# Patient Record
Sex: Female | Born: 1995 | Race: Black or African American | Hispanic: No | Marital: Single | Smoking: Never smoker
Health system: Southern US, Community
[De-identification: ages and names within clinical notes are randomized; demographics above are authoritative.]

## PROBLEM LIST (undated history)

## (undated) DIAGNOSIS — F419 Anxiety disorder, unspecified: Secondary | ICD-10-CM

---

## 2018-12-17 ENCOUNTER — Emergency Department
Admission: EM | Admit: 2018-12-17 | Discharge: 2018-12-17 | Disposition: A | Discharge status: Home / Self Care | Payer: BLUE CROSS/BLUE SHIELD | Attending: Emergency Medicine | Admitting: Emergency Medicine

## 2018-12-17 ENCOUNTER — Encounter: Payer: Self-pay | Admitting: Emergency Medicine

## 2018-12-17 ENCOUNTER — Emergency Department: Payer: BLUE CROSS/BLUE SHIELD

## 2018-12-17 ENCOUNTER — Other Ambulatory Visit: Payer: Self-pay

## 2018-12-17 DIAGNOSIS — R1084 Generalized abdominal pain: Secondary | ICD-10-CM | POA: Diagnosis present

## 2018-12-17 DIAGNOSIS — R7401 Elevation of levels of liver transaminase levels: Secondary | ICD-10-CM

## 2018-12-17 DIAGNOSIS — R740 Nonspecific elevation of levels of transaminase and lactic acid dehydrogenase [LDH]: Secondary | ICD-10-CM | POA: Diagnosis not present

## 2018-12-17 LAB — URINALYSIS, COMPLETE (UACMP) WITH MICROSCOPIC
Bilirubin Urine: NEGATIVE
GLUCOSE, UA: NEGATIVE mg/dL
Hgb urine dipstick: NEGATIVE
Ketones, ur: NEGATIVE mg/dL
Leukocytes,Ua: NEGATIVE
NITRITE: NEGATIVE
Protein, ur: NEGATIVE mg/dL
Specific Gravity, Urine: 1.027 (ref 1.005–1.030)
pH: 5 (ref 5.0–8.0)

## 2018-12-17 LAB — COMPREHENSIVE METABOLIC PANEL
ALT: 84 U/L — ABNORMAL HIGH (ref 0–44)
ANION GAP: 10 (ref 5–15)
AST: 147 U/L — ABNORMAL HIGH (ref 15–41)
Albumin: 4.7 g/dL (ref 3.5–5.0)
Alkaline Phosphatase: 62 U/L (ref 38–126)
BILIRUBIN TOTAL: 1.4 mg/dL — AB (ref 0.3–1.2)
BUN: 14 mg/dL (ref 6–20)
CO2: 23 mmol/L (ref 22–32)
Calcium: 8.7 mg/dL — ABNORMAL LOW (ref 8.9–10.3)
Chloride: 103 mmol/L (ref 98–111)
Creatinine, Ser: 0.69 mg/dL (ref 0.44–1.00)
GFR calc Af Amer: >60 mL/min (ref >60)
GFR calc non Af Amer: >60 mL/min (ref >60)
Glucose, Bld: 111 mg/dL — ABNORMAL HIGH (ref 70–99)
Potassium: 3.3 mmol/L — ABNORMAL LOW (ref 3.5–5.1)
Sodium: 136 mmol/L (ref 135–145)
TOTAL PROTEIN: 8.3 g/dL — AB (ref 6.5–8.1)

## 2018-12-17 LAB — CBC
HCT: 40.6 % (ref 36.0–46.0)
Hemoglobin: 13.1 g/dL (ref 12.0–15.0)
MCH: 31.3 pg (ref 26.0–34.0)
MCHC: 32.3 g/dL (ref 30.0–36.0)
MCV: 97.1 fL (ref 80.0–100.0)
Platelets: 265 10*3/uL (ref 150–400)
RBC: 4.18 MIL/uL (ref 3.87–5.11)
RDW: 12 % (ref 11.5–15.5)
WBC: 14.9 10*3/uL — AB (ref 4.0–10.5)
nRBC: 0 % (ref 0.0–0.2)

## 2018-12-17 LAB — POCT PREGNANCY, URINE: Preg Test, Ur: NEGATIVE

## 2018-12-17 LAB — LIPASE, BLOOD: Lipase: 32 U/L (ref 11–51)

## 2018-12-17 MED ORDER — IBUPROFEN 100 MG/5ML PO SUSP
400.0000 mg | Freq: Once | ORAL | Status: AC
  Administered 2018-12-17: 400 mg via ORAL

## 2018-12-17 MED ORDER — IBUPROFEN 100 MG/5ML PO SUSP
ORAL | Status: AC
  Filled 2018-12-17: qty 20

## 2018-12-17 MED ORDER — SODIUM CHLORIDE 0.9% FLUSH: 3.0000 mL | Freq: Once | INTRAVENOUS | Status: DC

## 2018-12-17 MED ORDER — IBUPROFEN 400 MG PO TABS
400.0000 mg | ORAL_TABLET | Freq: Once | ORAL | Status: DC
  Filled 2018-12-17: qty 1

## 2018-12-17 NOTE — Discharge Instructions (Signed)
As explained to you your labs showed abnormal liver enzymes.  Ultrasound of your liver was normal.  It is very important that you follow-up with your doctor within the next 3 to 4 days to repeat the lab work to make sure this is not getting worse.  It is also important to you return to the emergency room if you have abdominal pain, fever, vomiting.

## 2018-12-17 NOTE — ED Provider Notes (Addendum)
[facility] Emergency Department Provider Note  ____________________________________________  Time seen: Approximately 9:17 PM  I have reviewed the triage vital signs and the nursing notes.   HISTORY  Chief Complaint Abdominal Pain   HPI Deborah Floyd is a 23 y.o. female no significant past medical history who presents for evaluation of abdominal pain.  Patient reports mild diffuse pressure-like abdominal pain worse in the center of her abdomen which has been present most of the day today.  She reports that after getting to a room in the ER she urinated and the pain went away.  She denies nausea, vomiting, diarrhea, fever, chills, dysuria, hematuria, vaginal bleeding, vaginal discharge.  She denies any prior abdominal surgeries.  She reports that she has been constipated with the last bowel movement being 2 days ago.  PMH None - reviewed  Allergies Patient has no known allergies.  History reviewed. No pertinent family history.  Social History Smoking - no Alcohol - no Drugs - no  Review of Systems  Constitutional: Negative for fever. Eyes: Negative for visual changes. ENT: Negative for sore throat. Neck: No neck pain  Cardiovascular: Negative for chest pain. Respiratory: Negative for shortness of breath. Gastrointestinal: + abdominal pain and constipation. No vomiting or diarrhea. Genitourinary: Negative for dysuria. Musculoskeletal: Negative for back pain. Skin: Negative for rash. Neurological: Negative for headaches, weakness or numbness. Psych: No SI or HI  ____________________________________________   PHYSICAL EXAM:  VITAL SIGNS: ED Triage Vitals [12/17/18 2043]  Enc Vitals Group     BP 117/69     Pulse Rate 98     Resp 17     Temp 98.6 F (37 C)     Temp src      SpO2 99 %     Weight 93 lb (42.2 kg)     Height 5' (1.524 m)     Head Circumference      Peak Flow      Pain Score      Pain Loc      Pain Edu?    Excl. in [place]?     Constitutional: Alert and oriented. Well appearing and in no apparent distress. HEENT:      Head: Normocephalic and atraumatic.         Eyes: Conjunctivae are normal. Sclera is non-icteric.       Mouth/Throat: Mucous membranes are moist.       Neck: Supple with no signs of meningismus. Cardiovascular: Regular rate and rhythm. No murmurs, gallops, or rubs. 2+ symmetrical distal pulses are present in all extremities. No JVD. Respiratory: Normal respiratory effort. Lungs are clear to auscultation bilaterally. No wheezes, crackles, or rhonchi.  Gastrointestinal: Soft, non tender, and non distended with positive bowel sounds. No rebound or guarding. Genitourinary: No CVA tenderness. Musculoskeletal: Nontender with normal range of motion in all extremities. No edema, cyanosis, or erythema of extremities. Neurologic: Normal speech and language. Face is symmetric. Moving all extremities. No gross focal neurologic deficits are appreciated. Skin: Skin is warm, dry and intact. No rash noted. Psychiatric: Mood and affect are normal. Speech and behavior are normal.  ____________________________________________   LABS (all labs ordered are listed, but only abnormal results are displayed)  Labs Reviewed  COMPREHENSIVE METABOLIC PANEL - Abnormal; Notable for the following components:      Result Value   Potassium 3.3 (*)    Glucose, Bld 111 (*)    Calcium 8.7 (*)    Total Protein 8.3 (*)  AST 147 (*)    ALT 84 (*)    Total Bilirubin 1.4 (*)    All other components within normal limits  CBC - Abnormal; Notable for the following components:   WBC 14.9 (*)    All other components within normal limits  URINALYSIS, COMPLETE (UACMP) WITH MICROSCOPIC - Abnormal; Notable for the following components:   Color, Urine AMBER (*)    APPearance CLEAR (*)    Bacteria, UA RARE (*)    All other components within normal limits  LIPASE, BLOOD  HEPATITIS PANEL, ACUTE  POC URINE PREG, ED    POCT PREGNANCY, URINE   ____________________________________________  EKG  none  ____________________________________________  RADIOLOGY  I have personally reviewed the images performed during this visit and I agree with the Radiologist's read.   Interpretation by Radiologist:  Dg Abdomen 1 View  Result Date: 12/17/2018 CLINICAL DATA:  Abdominal pain EXAM: ABDOMEN - 1 VIEW COMPARISON:  None. FINDINGS: There is stool throughout much of the colon. There is no bowel dilatation or air-fluid level to suggest bowel obstruction. No free air. Lung bases are clear. No abnormal calcifications. IMPRESSION: Diffuse stool throughout much of colon. Question a degree of constipation. No bowel obstruction or free air evident. Lung bases clear. Electronically Signed   By: Bretta Bang III M.D.   On: 12/17/2018 21:30   US Abdomen Limited Ruq  Result Date: 12/17/2018 CLINICAL DATA:  Abnormal liver function tests EXAM: ULTRASOUND ABDOMEN LIMITED RIGHT UPPER QUADRANT COMPARISON:  None. FINDINGS: Gallbladder: No gallstones or wall thickening visualized. No sonographic Murphy sign noted by sonographer. Common bile duct: Diameter: 1 mm Liver: No focal lesion identified. Within normal limits in parenchymal echogenicity. Portal vein is patent on color Doppler imaging with normal direction of blood flow towards the liver. IMPRESSION: Normal right upper quadrant ultrasound. Electronically Signed   By: Deatra Robinson M.D.   On: 12/17/2018 23:11      ____________________________________________   PROCEDURES  Procedure(s) performed: None Procedures Critical Care performed:  None ____________________________________________   INITIAL IMPRESSION / ASSESSMENT AND PLAN / ED COURSE  23 y.o. female no significant past medical history who presents for evaluation of abdominal pressure which has now resolved, no associated symptoms.  Patient is extremely well-appearing, no distress, normal vital signs, abdomen  is soft with no tenderness throughout.  Differential diagnosis including constipation versus indigestion versus UTI versus renal colic vs GB colic.  With no tenderness and completely resolved pain low suspicion for ovarian pathology, appendicitis, diverticulitis, SBO.  Will get a KUB, pregnancy test, basic labs, and urinalysis.  Will give Motrin.  _________________________ 9:44 PM on 12/17/2018 ----------------------------------------- LFTs elevated, mildly elevated Tbili, will get [place] to rule out cholecystitis. Patient remains pain free with no tenderness.   Clinical Course as of Dec 16 2320  Mon Dec 17, 2018  2319 [place] with no acute findings. Patient remains with no abdominal pain or tenderness. Has taken one tylenol recently for menstrual period. No alcohol use. Acute hepatitis panel pending. Will dc with f/u with PCP in 2-3 days for re-check of LFTs, discussed return precautions for recurrence of pain or fever.    [CV]    Clinical Course User Index [CV] Don Perking [place], MD     As part of my medical decision making, I reviewed the following data within the electronic MEDICAL RECORD NUMBER Nursing notes reviewed and incorporated, Labs reviewed , Old chart reviewed, Radiograph reviewed , Notes from prior ED visits and [place] Controlled Substance Database  Pertinent labs & imaging results that were available during my care of the patient were reviewed by me and considered in my medical decision making (see chart for details).    ____________________________________________   FINAL CLINICAL IMPRESSION(S) / ED DIAGNOSES  Final diagnoses:  Transaminitis  Generalized abdominal pain      NEW MEDICATIONS STARTED DURING THIS VISIT:  ED Discharge Orders    None       Note:  This document was prepared using Dragon voice recognition software and may include unintentional dictation errors.    Don Perking, [place], MD 12/17/18 2245    Don Perking [place], MD 12/17/18 2322

## 2018-12-17 NOTE — ED Triage Notes (Signed)
Pt c/o generalized intermittent abdominal pain since 1800 today. Pt denies N/V/D. Pt sts pain is worse when standing or moving.

## 2018-12-19 LAB — HEPATITIS PANEL, ACUTE
HCV Ab: <0.1 s/co ratio (ref 0.0–0.9)
HEP A IGM: NEGATIVE
HEP B C IGM: NEGATIVE
Hepatitis B Surface Ag: NEGATIVE

## 2019-12-22 ENCOUNTER — Ambulatory Visit: Payer: Self-pay | Attending: Internal Medicine

## 2019-12-22 DIAGNOSIS — Z23 Encounter for immunization: Secondary | ICD-10-CM

## 2019-12-22 NOTE — Progress Notes (Signed)
   Covid-19 Vaccination Clinic  Name:  Deborah Floyd    MRN: [id number] DOB: 1995/10/21  12/22/2019  [name] was observed post Covid-19 immunization for 15 minutes without incident. She was provided with Vaccine Information Sheet and instruction to access the V-Safe system.   [name] was instructed to call 911 with any severe reactions post vaccine: Marland Kitchen Difficulty breathing  . Swelling of face and throat  . A fast heartbeat  . A bad rash all over body  . Dizziness and weakness   Immunizations Administered    Name Date Dose VIS Date Route   Pfizer COVID-19 Vaccine 12/22/2019  2:12 PM 0.3 mL 09/06/2019 Intramuscular   Manufacturer: [employer], [employer]   Lot: [id number]   NDC: [address]-0

## 2020-01-14 ENCOUNTER — Ambulatory Visit: Payer: Self-pay | Attending: Internal Medicine

## 2020-01-14 DIAGNOSIS — Z23 Encounter for immunization: Secondary | ICD-10-CM

## 2020-01-14 NOTE — Progress Notes (Signed)
   Covid-19 Vaccination Clinic  Name:  Ayven Glasco    MRN: [id number] DOB: [birth year]  01/14/2020  Ms. Velador was observed post Covid-19 immunization for 15 minutes without incident. She was provided with Vaccine Information Sheet and instruction to access the V-Safe system.   Ms. Wellen was instructed to call 911 with any severe reactions post vaccine: Marland Kitchen Difficulty breathing  . Swelling of face and throat  . A fast heartbeat  . A bad rash all over body  . Dizziness and weakness   Immunizations Administered    Name Date Dose VIS Date Route   Pfizer COVID-19 Vaccine 01/14/2020  2:39 PM 0.3 mL 11/20/2018 Intramuscular   Manufacturer: [employer], [employer]   Lot: [id number]   NDC: [address]-8

## 2021-01-02 IMAGING — US ULTRASOUND ABDOMEN LIMITED
1 series · 14 of 25 positions shown · non-contrast
Comparison: None.

CLINICAL DATA: Abnormal liver function tests

EXAM:
ULTRASOUND ABDOMEN LIMITED RIGHT UPPER QUADRANT

[Series 1: ultrasound abdomen limited · 0.14mm/px · 14 of 47 slices shown]
[im 1/47]
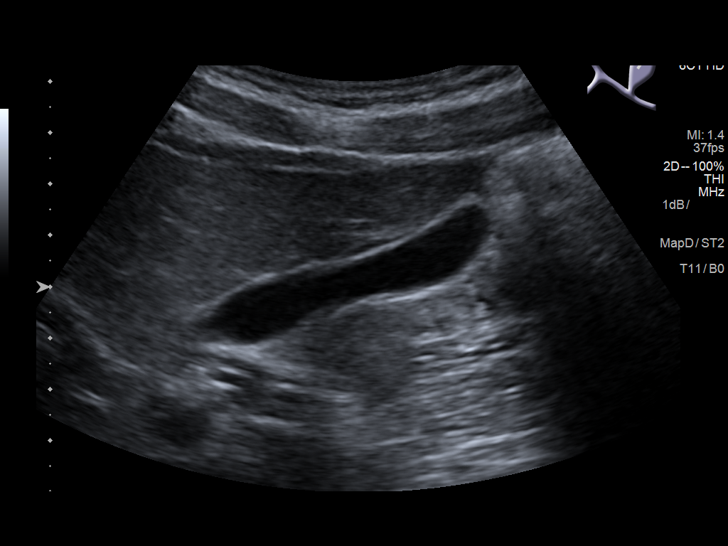
[im 4/47]
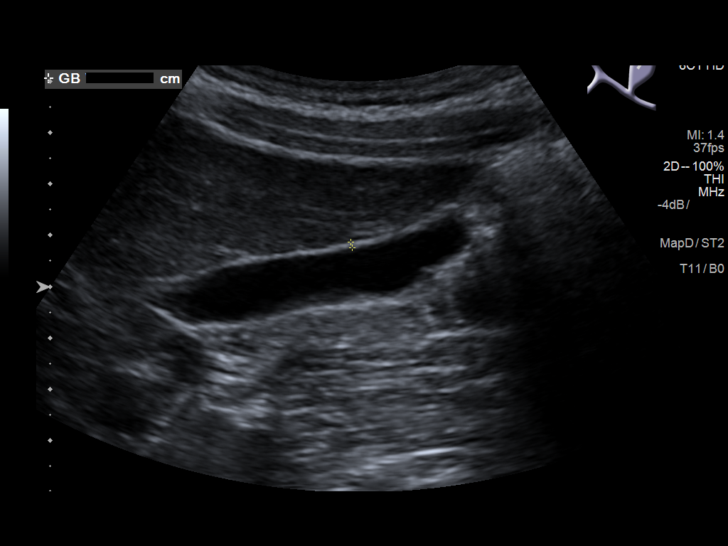
[im 8/47]
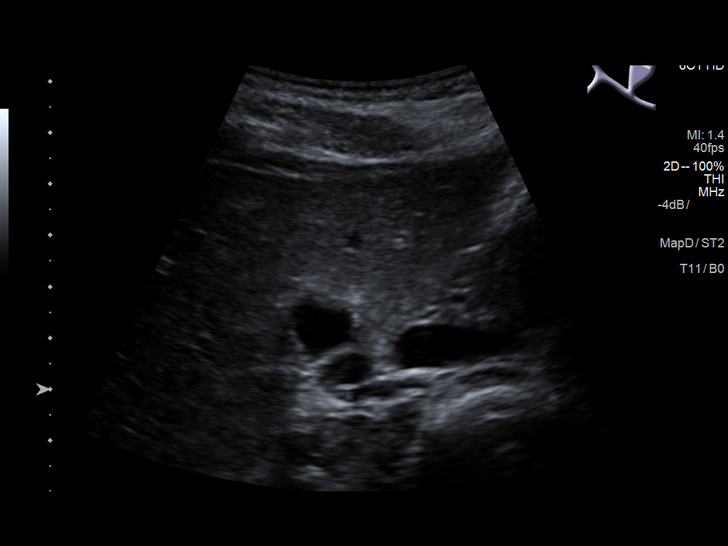
[im 12/47]
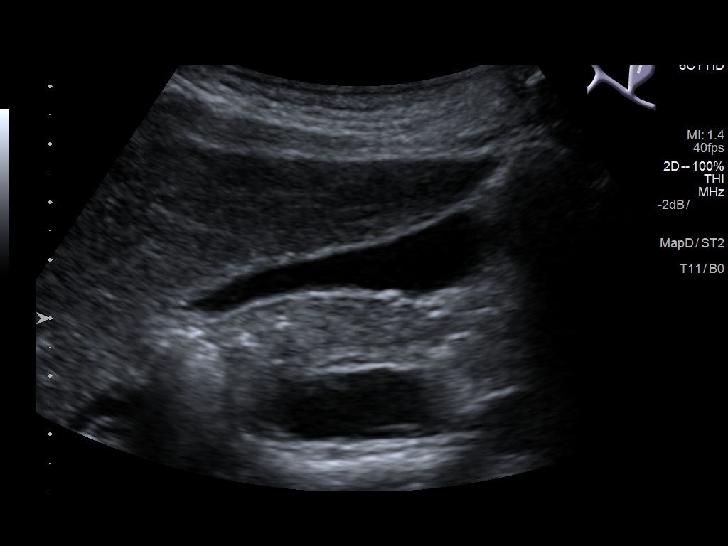
[im 16/47]
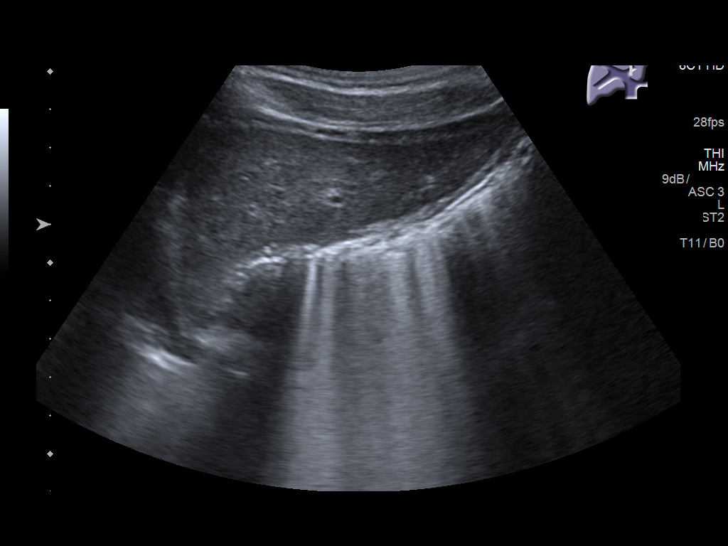
[im 18/47]
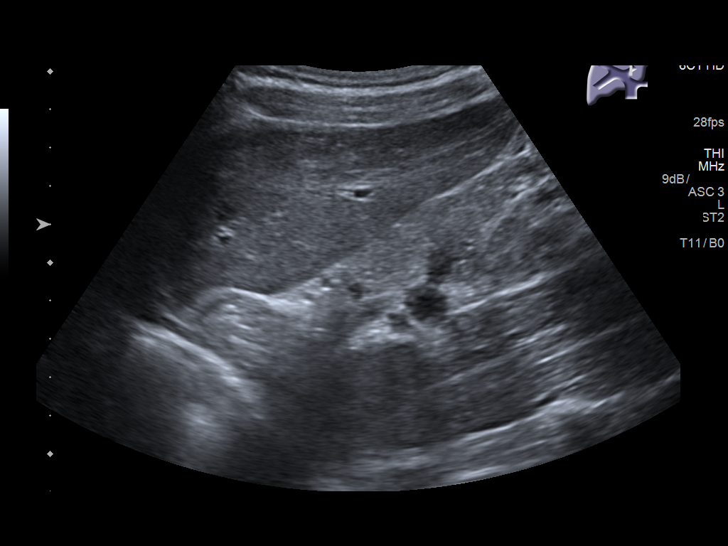
[im 22/47]
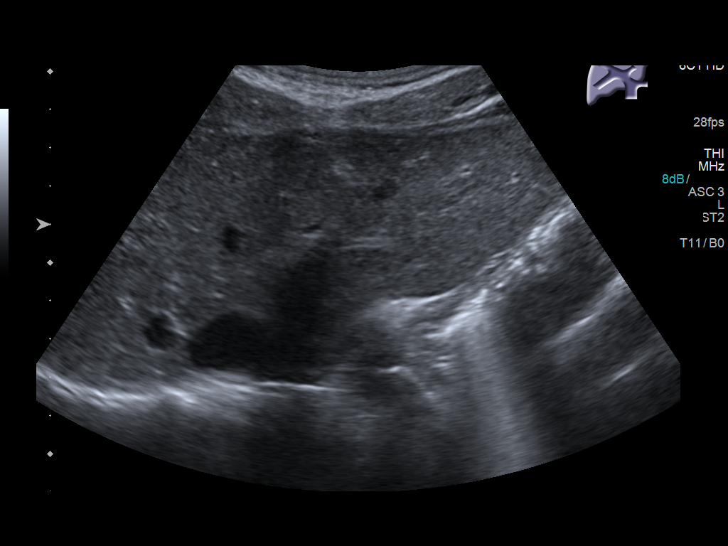
[im 25/47]
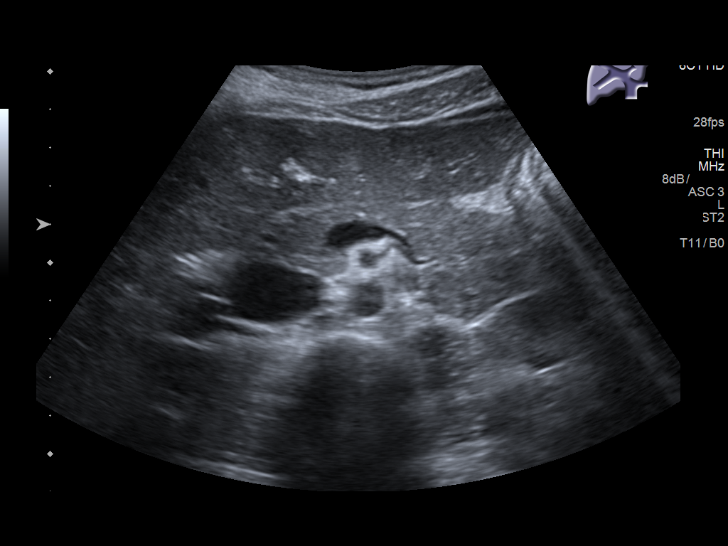
[im 29/47]
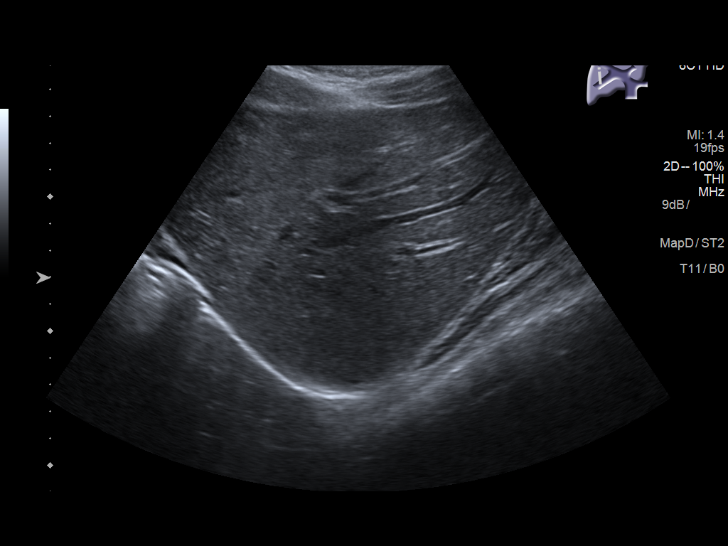
[im 31/47]
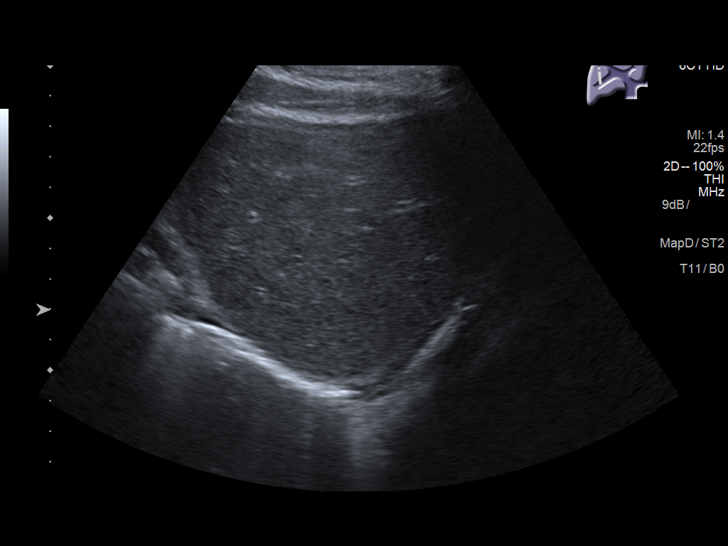
[im 35/47]
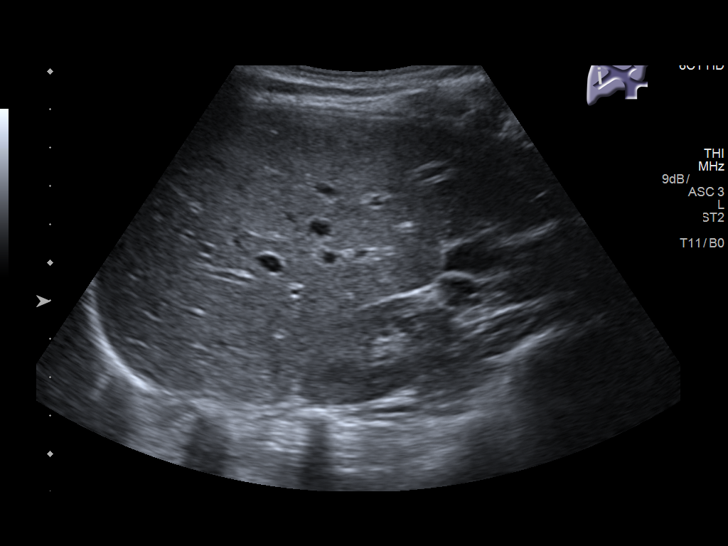
[im 39/47]
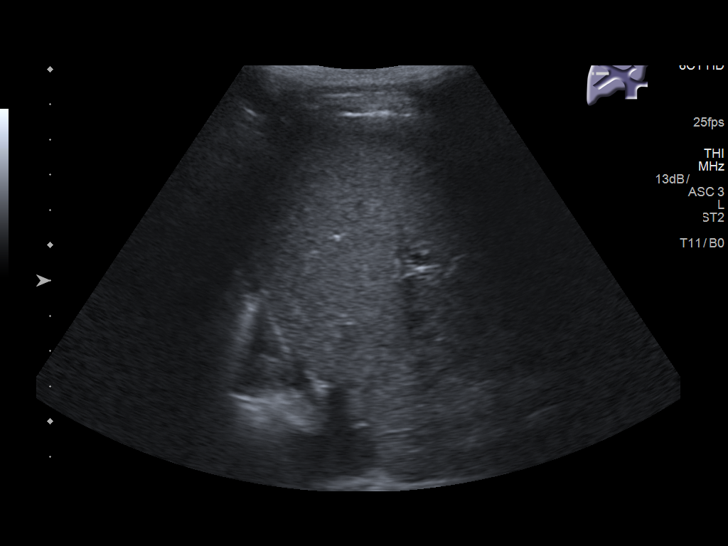
[im 43/47]
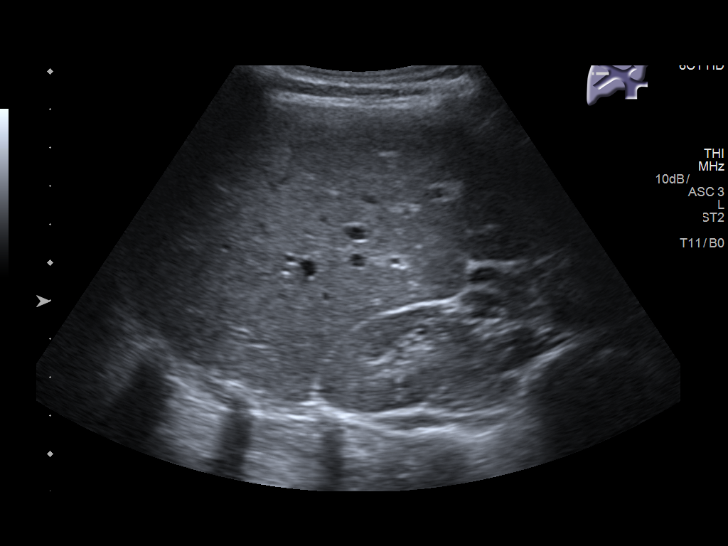
[im 47/47]
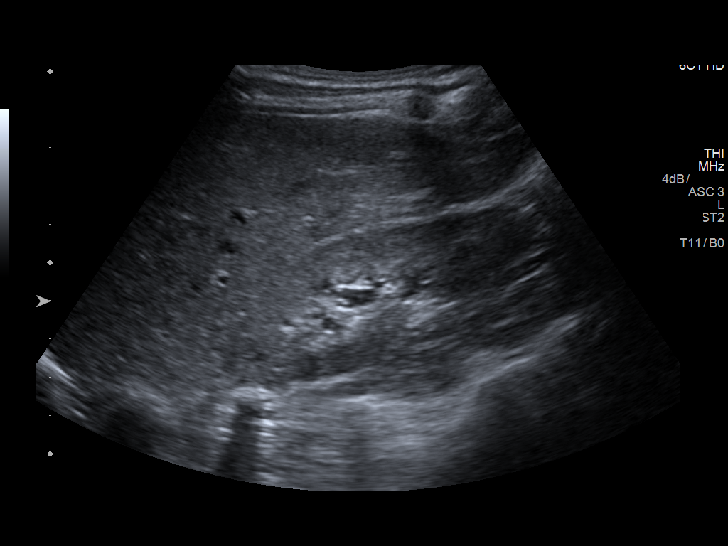

[14 of 25 positions shown; findings below may reference images not displayed]

FINDINGS: Gallbladder:

No gallstones or wall thickening visualized. No sonographic Murphy
sign noted by sonographer.

Common bile duct:

Diameter: 1 mm

Liver:

No focal lesion identified. Within normal limits in parenchymal
echogenicity. Portal vein is patent on color Doppler imaging with
normal direction of blood flow towards the liver.
IMPRESSION: Normal right upper quadrant ultrasound.

## 2021-10-16 ENCOUNTER — Other Ambulatory Visit: Payer: Self-pay

## 2021-10-16 ENCOUNTER — Encounter: Payer: Self-pay | Admitting: Emergency Medicine

## 2021-10-16 ENCOUNTER — Ambulatory Visit
Admission: EM | Admit: 2021-10-16 | Discharge: 2021-10-16 | Disposition: A | Discharge status: Home / Self Care | Payer: BC Managed Care – PPO | Attending: Family Medicine | Admitting: Family Medicine

## 2021-10-16 DIAGNOSIS — J0100 Acute maxillary sinusitis, unspecified: Secondary | ICD-10-CM

## 2021-10-16 MED ORDER — CEFDINIR 250 MG/5ML PO SUSR: 300.0000 mg | Freq: Two times a day (BID) | ORAL | 0 refills | Status: AC

## 2021-10-16 NOTE — ED Provider Notes (Signed)
Deborah Floyd    CSN: [id number] Arrival date & time: 10/16/21  1330      History   Chief Complaint Chief Complaint  Patient presents with   Nasal Congestion   Sinus Pressure   Sore Throat   Cough    HPI Deborah Floyd is a 26 y.o. female.   HPI Patient presents today nasal congestion, scratchy throat, sinus pressure, cough and nasal congestion x 1 week. No measurable fever. No known sick contacts. She has attempted  relief with over the counter medications with minimal relief.Denies chest pain, shortness of breath, or wheezing.   History reviewed. No pertinent past medical history.  There are no problems to display for this patient.   History reviewed. No pertinent surgical history.  OB History   No obstetric history on file.      Home Medications    Prior to Admission medications   Not on File    Family History History reviewed. No pertinent family history.  Social History Social History   Tobacco Use   Smoking status: Never   Smokeless tobacco: Never  Vaping Use   Vaping Use: Never used  Substance Use Topics   Alcohol use: Not Currently   Drug use: Never     Allergies   Patient has no known allergies.   Review of Systems Review of Systems Pertinent negatives listed in HPI  Physical Exam Triage Vital Signs ED Triage Vitals  Enc Vitals Group     BP 10/16/21 1435 129/84     Pulse Rate 10/16/21 1435 (!) 105     Resp 10/16/21 1435 18     Temp 10/16/21 1435 98.5 F (36.9 C)     Temp Source 10/16/21 1435 Oral     SpO2 10/16/21 1435 99 %     Weight --      Height --      Head Circumference --      Peak Flow --      Pain Score 10/16/21 1433 0     Pain Loc --      Pain Edu? --      Excl. in [place]? --    No data found.  Updated Vital Signs BP 129/84 (BP Location: Left Arm)    Pulse (!) 105    Temp 98.5 F (36.9 C) (Oral)    Resp 18    LMP 10/04/2021    SpO2 99%   Visual Acuity Right Eye Distance:   Left Eye  Distance:   Bilateral Distance:    Right Eye Near:   Left Eye Near:    Bilateral Near:     Physical Exam  General Appearance:    Alert, cooperative, no distress  HENT:   Normocephalic, ears normal, nares mucosal edema with congestion, rhinorrhea, oropharynx mild erythema   Eyes:    PERRL, conjunctiva/corneas clear, EOM's intact       Lungs:     Clear to auscultation bilaterally, respirations unlabored  Heart:    Regular rate and rhythm  Neurologic:   Awake, alert, oriented x 3. No apparent focal neurological           defect.      UC Treatments / Results  Labs (all labs ordered are listed, but only abnormal results are displayed) Labs Reviewed - No data to display  EKG   Radiology No results found.  Procedures Procedures (including critical care time)  Medications Ordered in UC Medications - No data to display  Initial Impression /  Assessment and Plan / UC Course  I have reviewed the triage vital signs and the nursing notes.  Pertinent labs & imaging results that were available during my care of the patient were reviewed by me and considered in my medical decision making (see chart for details).    Acute Sinusitis,  Treatment per discharge medication orders. Force fluids RTC PRN if symptoms worsen or do not improve. Final Clinical Impressions(s) / UC Diagnoses   Final diagnoses:  Acute non-recurrent maxillary sinusitis   Discharge Instructions   None    ED Prescriptions     Medication Sig Dispense Auth. Provider   cefdinir (OMNICEF) 250 MG/5ML suspension Take 6 mLs (300 mg total) by mouth 2 (two) times daily for 10 days. 120 mL Scot Jun, FNP      PDMP not reviewed this encounter.   Scot Jun, FNP 10/18/21 [phone]

## 2021-10-16 NOTE — ED Triage Notes (Signed)
Pt presents with scratchy throat, sinus pressure, cough and nasal congestion x 1 week

## 2022-12-23 ENCOUNTER — Ambulatory Visit
Admission: EM | Admit: 2022-12-23 | Discharge: 2022-12-23 | Disposition: A | Discharge status: Home / Self Care | Payer: BC Managed Care – PPO | Attending: Urgent Care | Admitting: Urgent Care

## 2022-12-23 DIAGNOSIS — B9689 Other specified bacterial agents as the cause of diseases classified elsewhere: Secondary | ICD-10-CM

## 2022-12-23 DIAGNOSIS — J019 Acute sinusitis, unspecified: Secondary | ICD-10-CM

## 2022-12-23 MED ORDER — AMOXICILLIN-POT CLAVULANATE 600-42.9 MG/5ML PO SUSR: 875.0000 mg | Freq: Two times a day (BID) | ORAL | 0 refills | Status: AC

## 2022-12-23 MED ORDER — AMOXICILLIN-POT CLAVULANATE 875-125 MG PO TABS: 1.0000 | ORAL_TABLET | Freq: Two times a day (BID) | ORAL | 0 refills | Status: DC

## 2022-12-23 NOTE — ED Triage Notes (Signed)
Pt states she is having sinus pressure/pain with nasal congestion and irritation in sinus passage that started 3 weeks ago. Pt also states she is having pain on one of her bottom teeth. Taking nucin and tylenol with no relief.

## 2022-12-23 NOTE — Discharge Instructions (Signed)
Follow up here or with your primary care provider if your symptoms are worsening or not improving with treatment.     

## 2022-12-23 NOTE — ED Provider Notes (Signed)
Deborah Floyd    CSN: [id number] Arrival date & time: 12/23/22  1411      History   Chief Complaint Chief Complaint  Patient presents with   Facial Pain    HPI Deborah Floyd is a 27 y.o. female.   HPI  Presents to urgent care with complaint of sinus pressure/pain starting 3 weeks ago.  No nasal congestion today.  Endorses pain in a bottom tooth.  She states symptoms cause "dull headache".  History reviewed. No pertinent past medical history.  There are no problems to display for this patient.   History reviewed. No pertinent surgical history.  OB History   No obstetric history on file.      Home Medications    Prior to Admission medications   Not on File    Family History History reviewed. No pertinent family history.  Social History Social History   Tobacco Use   Smoking status: Never   Smokeless tobacco: Never  Vaping Use   Vaping Use: Never used  Substance Use Topics   Alcohol use: Not Currently   Drug use: Never     Allergies   Patient has no known allergies.   Review of Systems Review of Systems   Physical Exam Triage Vital Signs ED Triage Vitals [12/23/22 1437]  Enc Vitals Group     BP 128/76     Pulse Rate 79     Resp 16     Temp 98.8 F (37.1 C)     Temp Source Oral     SpO2 99 %     Weight      Height      Head Circumference      Peak Flow      Pain Score 0     Pain Loc      Pain Edu?      Excl. in [place]?    No data found.  Updated Vital Signs BP 128/76 (BP Location: Right Arm)   Pulse 79   Temp 98.8 F (37.1 C) (Oral)   Resp 16   LMP 12/23/2022 (Exact Date)   SpO2 99%   Visual Acuity Right Eye Distance:   Left Eye Distance:   Bilateral Distance:    Right Eye Near:   Left Eye Near:    Bilateral Near:     Physical Exam Vitals reviewed.  Constitutional:      Appearance: Normal appearance.  HENT:     Right Ear: Tympanic membrane normal.     Left Ear: Tympanic membrane normal.      Mouth/Throat:     Mouth: Mucous membranes are moist.     Pharynx: No oropharyngeal exudate or posterior oropharyngeal erythema.  Cardiovascular:     Rate and Rhythm: Normal rate and regular rhythm.  Pulmonary:     Effort: Pulmonary effort is normal.     Breath sounds: Normal breath sounds.  Skin:    General: Skin is warm and dry.  Neurological:     General: No focal deficit present.     Mental Status: She is alert and oriented to person, place, and time.  Psychiatric:        Mood and Affect: Mood normal.        Behavior: Behavior normal.      UC Treatments / Results  Labs (all labs ordered are listed, but only abnormal results are displayed) Labs Reviewed - No data to display  EKG   Radiology No results found.  Procedures Procedures (including  critical care time)  Medications Ordered in UC Medications - No data to display  Initial Impression / Assessment and Plan / UC Course  I have reviewed the triage vital signs and the nursing notes.  Pertinent labs & imaging results that were available during my care of the patient were reviewed by me and considered in my medical decision making (see chart for details).   Patient is afebrile here without recent antipyretics. Satting well on room air. Overall is well appearing, well hydrated, without respiratory distress.  No pharyngeal erythema or peritonsillar exudates.  Negative for maxillary or frontal sinus tenderness with palpation.  Given reported symptoms of 3 weeks concern for possible bacterial sinusitis versus allergic.  Will prescribe antibiotic therapy however recommending continued use of allergic treatment including oral nondrowsy antihistamine and Flonase.  Patient acknowledges understanding and agreement with this treatment plan.  Final Clinical Impressions(s) / UC Diagnoses   Final diagnoses:  None   Discharge Instructions   None    ED Prescriptions   None    PDMP not reviewed this encounter.    Rose Phi, [place] 12/23/22 1511

## 2023-05-07 ENCOUNTER — Ambulatory Visit
Admission: EM | Admit: 2023-05-07 | Discharge: 2023-05-07 | Disposition: A | Discharge status: Home / Self Care | Payer: BC Managed Care – PPO | Attending: Emergency Medicine | Admitting: Emergency Medicine

## 2023-05-07 DIAGNOSIS — H60503 Unspecified acute noninfective otitis externa, bilateral: Secondary | ICD-10-CM

## 2023-05-07 MED ORDER — OFLOXACIN 0.3 % OT SOLN: 10.0000 [drp] | Freq: Every day | OTIC | 0 refills | Status: AC

## 2023-05-07 NOTE — Discharge Instructions (Addendum)
Use the ear drops as directed.  Follow up with your primary care provider if your symptoms are not improving.    

## 2023-05-07 NOTE — ED Triage Notes (Signed)
Patient presents to [facility] for bilateral inner ear swelling and irritation since weds. She states she uses ear plugs at work. Has tried cleaning ears with hydrogen peroxide.

## 2023-05-07 NOTE — ED Provider Notes (Signed)
Renaldo Fiddler    CSN: [id number] Arrival date & time: 05/07/23  0815      History   Chief Complaint Chief Complaint  Patient presents with   Otalgia    HPI Deborah Floyd is a 27 y.o. female.  Patient presents with bilateral ear pain x 5 days.  She has been treating this with hydrogen peroxide.  She wears earplugs at work due to tinnitus.  She denies fever, chills, ear drainage, sore throat, cough, or other symptoms.  She denies pertinent medical history.  The history is provided by the patient and medical records.    History reviewed. No pertinent past medical history.  There are no problems to display for this patient.   History reviewed. No pertinent surgical history.  OB History   No obstetric history on file.      Home Medications    Prior to Admission medications   Medication Sig Start Date End Date Taking? Authorizing Provider  ofloxacin (FLOXIN) 0.3 % OTIC solution Place 10 drops into both ears daily. 05/07/23  Yes Mickie Bail, NP    Family History History reviewed. No pertinent family history.  Social History Social History   Tobacco Use   Smoking status: Never   Smokeless tobacco: Never  Vaping Use   Vaping status: Never Used  Substance Use Topics   Alcohol use: Not Currently   Drug use: Never     Allergies   Patient has no known allergies.   Review of Systems Review of Systems  Constitutional:  Negative for chills and fever.  HENT:  Positive for ear pain. Negative for ear discharge and sore throat.   Respiratory:  Negative for cough and shortness of breath.      Physical Exam Triage Vital Signs ED Triage Vitals  Encounter Vitals Group     BP      Systolic BP Percentile      Diastolic BP Percentile      Pulse      Resp      Temp      Temp src      SpO2      Weight      Height      Head Circumference      Peak Flow      Pain Score      Pain Loc      Pain Education      Exclude from Growth Chart    No  data found.  Updated Vital Signs BP 127/79 (BP Location: Left Arm)   Pulse 84   Temp 97.8 F (36.6 C) (Temporal)   Resp 16   LMP 05/07/2023   SpO2 96%   Visual Acuity Right Eye Distance:   Left Eye Distance:   Bilateral Distance:    Right Eye Near:   Left Eye Near:    Bilateral Near:     Physical Exam Vitals and nursing note reviewed.  Constitutional:      General: She is not in acute distress.    Appearance: Normal appearance. She is well-developed.  HENT:     Right Ear: Tympanic membrane normal.     Left Ear: Tympanic membrane normal.     Ears:     Comments: Bilateral ear canals mildly erythematous and mildly tender.    Nose: Nose normal.     Mouth/Throat:     Mouth: Mucous membranes are moist.     Pharynx: Oropharynx is clear.  Cardiovascular:     Rate  and Rhythm: Normal rate and regular rhythm.     Heart sounds: Normal heart sounds.  Pulmonary:     Effort: Pulmonary effort is normal. No respiratory distress.     Breath sounds: Normal breath sounds.  Musculoskeletal:     Cervical back: Neck supple.  Skin:    General: Skin is warm and dry.  Neurological:     Mental Status: She is alert.      UC Treatments / Results  Labs (all labs ordered are listed, but only abnormal results are displayed) Labs Reviewed - No data to display  EKG   Radiology No results found.  Procedures Procedures (including critical care time)  Medications Ordered in UC Medications - No data to display  Initial Impression / Assessment and Plan / UC Course  I have reviewed the triage vital signs and the nursing notes.  Pertinent labs & imaging results that were available during my care of the patient were reviewed by me and considered in my medical decision making (see chart for details).    Bilateral otitis externa.  Treating with ofloxacin eardrops.  Now as needed.  Education provided on otitis externa.  Instructed patient to follow up with her PCP if her symptoms are not  improving.  She agrees to plan of care.   Final Clinical Impressions(s) / UC Diagnoses   Final diagnoses:  Acute otitis externa of both ears, unspecified type     Discharge Instructions      Use the ear drops as directed.  Follow up with your primary care provider if your symptoms are not improving.        ED Prescriptions     Medication Sig Dispense Auth. Provider   ofloxacin (FLOXIN) 0.3 % OTIC solution Place 10 drops into both ears daily. 5 mL Mickie Bail, NP      PDMP not reviewed this encounter.   Mickie Bail, NP 05/07/23 [phone]

## 2024-01-24 ENCOUNTER — Other Ambulatory Visit: Payer: Self-pay

## 2024-01-24 ENCOUNTER — Emergency Department

## 2024-01-24 ENCOUNTER — Emergency Department: Admission: EM | Admit: 2024-01-24 | Discharge: 2024-01-24 | Disposition: A | Discharge status: Home / Self Care

## 2024-01-24 DIAGNOSIS — R0789 Other chest pain: Secondary | ICD-10-CM | POA: Diagnosis present

## 2024-01-24 LAB — BASIC METABOLIC PANEL WITH GFR
Anion gap: 9 (ref 5–15)
BUN: 7 mg/dL (ref 6–20)
CO2: 25 mmol/L (ref 22–32)
Calcium: 9.4 mg/dL (ref 8.9–10.3)
Chloride: 102 mmol/L (ref 98–111)
Creatinine, Ser: 0.88 mg/dL (ref 0.44–1.00)
GFR, Estimated: >60 mL/min (ref >60)
Glucose, Bld: 129 mg/dL — ABNORMAL HIGH (ref 70–99)
Potassium: 3.5 mmol/L (ref 3.5–5.1)
Sodium: 136 mmol/L (ref 135–145)

## 2024-01-24 LAB — CBC
HCT: 37.4 % (ref 36.0–46.0)
Hemoglobin: 12.1 g/dL (ref 12.0–15.0)
MCH: 29.1 pg (ref 26.0–34.0)
MCHC: 32.4 g/dL (ref 30.0–36.0)
MCV: 89.9 fL (ref 80.0–100.0)
Platelets: 346 10*3/uL (ref 150–400)
RBC: 4.16 MIL/uL (ref 3.87–5.11)
RDW: 13.2 % (ref 11.5–15.5)
WBC: 5.5 10*3/uL (ref 4.0–10.5)
nRBC: 0 % (ref 0.0–0.2)

## 2024-01-24 LAB — POC URINE PREG, ED: Preg Test, Ur: NEGATIVE

## 2024-01-24 LAB — TROPONIN I (HIGH SENSITIVITY): Troponin I (High Sensitivity): <2 ng/L (ref <18)

## 2024-01-24 MED ORDER — ACETAMINOPHEN 325 MG PO TABS: 650.0000 mg | ORAL_TABLET | ORAL | 2 refills | Status: AC | PRN

## 2024-01-24 MED ORDER — FAMOTIDINE 20 MG PO TABS: 20.0000 mg | ORAL_TABLET | Freq: Two times a day (BID) | ORAL | 0 refills | Status: AC

## 2024-01-24 NOTE — Discharge Instructions (Addendum)
 Your evaluation in the emergency department was overall reassuring.  Your heart testing was normal, and I highly doubt a heart problem today.  I do suspect you may have irritation of your esophagus or the lining of your stomach, and I have started you on an antacid medication as well as Tylenol to use as needed for any ongoing discomfort.  Please do follow-up with your primary care doctor for reevaluation, and return to the emergency department with any new or worsening symptoms.

## 2024-01-24 NOTE — ED Provider Notes (Signed)
 [facility] Provider Note    Event Date/Time   First MD Initiated Contact with Patient 01/24/24 1250     (approximate)   History   Chest Pain  Pt reports onset of intermittent L chest tightness since 7am. Pt reports while driving she felt a burning sensation to her L chest which prompted her to come get evaluated. Pt denies other associated sx.    HPI Deborah Floyd is a 28 y.o. female no significant past medical history presents for evaluation of chest tightness - Central/left-sided, occurred this morning around 7 AM and again around 9 AM.  Lasted about 30 minutes.  Accompanied by belching, improved after belching. -No exertional symptoms -No history of DVT/PE, no recent surgery/stasis/travel, no leg swelling, no hormone use, no pleuritic discomfort -No family history of MI -Non-smoker     Physical Exam   Triage Vital Signs: ED Triage Vitals  Encounter Vitals Group     BP 01/24/24 1131 135/83     Systolic BP Percentile --      Diastolic BP Percentile --      Pulse Rate 01/24/24 1131 (!) 122     Resp 01/24/24 1131 16     Temp 01/24/24 1131 98.1 F (36.7 C)     Temp Source 01/24/24 1131 Oral     SpO2 01/24/24 1131 100 %     Weight 01/24/24 1134 105 lb (47.6 kg)     Height 01/24/24 1134 5' (1.524 m)     Head Circumference --      Peak Flow --      Pain Score 01/24/24 1133 1     Pain Loc --      Pain Education --      Exclude from Growth Chart --     Most recent vital signs: Vitals:   01/24/24 1131 01/24/24 1255  BP: 135/83 129/76  Pulse: (!) 122 95  Resp: 16 14  Temp: 98.1 F (36.7 C)   SpO2: 100% 100%     General: Awake, no distress.  CV:  Good peripheral perfusion. RRR, RP 2+. +mild ttp L upper chest wall. Resp:  Normal effort. CTAB Abd:  No distention. Nontender to deep palpation throughout    ED Results / Procedures / Treatments   Labs (all labs ordered are listed, but only abnormal results are displayed) Labs  Reviewed  BASIC METABOLIC PANEL WITH GFR - Abnormal; Notable for the following components:      Result Value   Glucose, Bld 129 (*)    All other components within normal limits  CBC  POC URINE PREG, ED  TROPONIN I (HIGH SENSITIVITY)     EKG  See ED course below   RADIOLOGY See ED course below    PROCEDURES:  Critical Care performed: No  Procedures   MEDICATIONS ORDERED IN ED: Medications - No data to display   IMPRESSION / MDM / ASSESSMENT AND PLAN / ED COURSE  I reviewed the triage vital signs and the nursing notes.                              DDX/MDM/AP: Differential diagnosis includes, but is not limited to, likely dyspepsia/GERD/bloating discomfort versus possible MSK strain.  Highly doubt ACS in this young healthy 28 year old female.  No clinical concern for PE at this time.    Plan: - Labs  -EKG - Chest x-ray   Patient's presentation is most consistent with  acute presentation with potential threat to life or bodily function.    ED course below. Labs with normal troponin, nonischemic EKG, unremarkable chest x-ray.  Patient very well-appearing here with atypical chest pain story.  Given timeframe of symptoms, no indication for repeat troponin.  Overall suspect likely GI etiology, will start on famotidine and Tylenol with ED return precautions in place and plan for PMD follow-up.  Patient agrees with plan.  Clinical Course as of 01/24/24 1344  Wed Jan 24, 2024  1330 Trop wnl Cbc, bmp unremarkable Hcg neg [MM]  1331 Cxr reviewed, unremarkable on my interpretation, radiology report reviewed [MM]  1331 Ecg = sinus tachycardia, rate 107, no ST elevation or depression, no significant repolarization abnormality, normal axis, normal intervals.  No evidence of ischemia nor arrhythmia on my read. [MM]    Clinical Course User Index [MM] Collis Deaner, MD     FINAL CLINICAL IMPRESSION(S) / ED DIAGNOSES   Final diagnoses:  Atypical chest pain     Rx /  DC Orders   ED Discharge Orders          Ordered    acetaminophen (TYLENOL) 325 MG tablet  Every 4 hours PRN        01/24/24 1343    famotidine (PEPCID) 20 MG tablet  2 times daily        01/24/24 1343             Note:  This document was prepared using Dragon voice recognition software and may include unintentional dictation errors.   Collis Deaner, MD 01/24/24 1344

## 2024-01-24 NOTE — ED Triage Notes (Signed)
 Pt reports onset of intermittent L chest tightness since 7am. Pt reports while driving she felt a burning sensation to her L chest which prompted her to come get evaluated. Pt denies other associated sx.

## 2024-02-06 ENCOUNTER — Ambulatory Visit
Admission: EM | Admit: 2024-02-06 | Discharge: 2024-02-06 | Disposition: A | Discharge status: Home / Self Care | Attending: Emergency Medicine | Admitting: Emergency Medicine

## 2024-02-06 DIAGNOSIS — R350 Frequency of micturition: Secondary | ICD-10-CM | POA: Diagnosis not present

## 2024-02-06 DIAGNOSIS — M545 Low back pain, unspecified: Secondary | ICD-10-CM | POA: Diagnosis not present

## 2024-02-06 LAB — POCT URINALYSIS DIP (MANUAL ENTRY)
Bilirubin, UA: NEGATIVE
Blood, UA: NEGATIVE
Glucose, UA: NEGATIVE mg/dL
Ketones, POC UA: NEGATIVE mg/dL
Leukocytes, UA: NEGATIVE
Nitrite, UA: NEGATIVE
Protein Ur, POC: NEGATIVE mg/dL
Spec Grav, UA: >=1.03 — AB (ref 1.010–1.025)
Urobilinogen, UA: 0.2 U/dL
pH, UA: 6 (ref 5.0–8.0)

## 2024-02-06 LAB — POCT URINE PREGNANCY: Preg Test, Ur: NEGATIVE

## 2024-02-06 NOTE — ED Triage Notes (Signed)
 Pt c/o urinary freq & lower back pain x6 days. Denies any concerns for STD.

## 2024-02-06 NOTE — Discharge Instructions (Addendum)
 Follow up with your primary care provider if your symptoms are not improving.

## 2024-02-06 NOTE — ED Provider Notes (Signed)
 Deborah Floyd    CSN: [id number] Arrival date & time: 02/06/24  1514      History   Chief Complaint Chief Complaint  Patient presents with   Dysuria   Back Pain    HPI Deborah Floyd is a 28 y.o. female.  Patient presents with 1 week history of urinary frequency and low back pain.  No falls or injury.  She denies numbness, weakness, saddle anesthesia, loss of bowel/bladder control, fever, abdominal pain, flank pain, dysuria, hematuria, vaginal discharge, pelvic pain.  No OTC medications today.  No pertinent medical history.  The history is provided by the patient and medical records.    History reviewed. No pertinent past medical history.  There are no active problems to display for this patient.   History reviewed. No pertinent surgical history.  OB History   No obstetric history on file.      Home Medications    Prior to Admission medications   Medication Sig Start Date End Date Taking? Authorizing Provider  acetaminophen  (TYLENOL ) 325 MG tablet Take 2 tablets (650 mg total) by mouth every 4 (four) hours as needed. 01/24/24 01/23/25  Collis Deaner, MD  famotidine  (PEPCID ) 20 MG tablet Take 1 tablet (20 mg total) by mouth 2 (two) times daily for 14 days. 01/24/24 02/07/24  Collis Deaner, MD  ofloxacin  (FLOXIN ) 0.3 % OTIC solution Place 10 drops into both ears daily. 05/07/23   Wellington Half, NP    Family History History reviewed. No pertinent family history.  Social History Social History   Tobacco Use   Smoking status: Never   Smokeless tobacco: Never  Vaping Use   Vaping status: Never Used  Substance Use Topics   Alcohol use: Not Currently   Drug use: Never     Allergies   Patient has no known allergies.   Review of Systems Review of Systems  Constitutional:  Negative for chills and fever.  Gastrointestinal:  Negative for abdominal pain.  Genitourinary:  Positive for frequency. Negative for dysuria, hematuria, pelvic pain and  vaginal discharge.  Musculoskeletal:  Positive for back pain. Negative for arthralgias, gait problem and joint swelling.  Neurological:  Negative for weakness and numbness.     Physical Exam Triage Vital Signs ED Triage Vitals  Encounter Vitals Group     BP      Systolic BP Percentile      Diastolic BP Percentile      Pulse      Resp      Temp      Temp src      SpO2      Weight      Height      Head Circumference      Peak Flow      Pain Score      Pain Loc      Pain Education      Exclude from Growth Chart    No data found.  Updated Vital Signs BP 120/80 (BP Location: Left Arm)   Pulse 78   Temp 99.2 F (37.3 C) (Oral)   Resp 16   Ht 5' (1.524 m)   Wt 106 lb (48.1 kg)   LMP 01/10/2024 (Approximate)   SpO2 98%   BMI 20.70 kg/m   Visual Acuity Right Eye Distance:   Left Eye Distance:   Bilateral Distance:    Right Eye Near:   Left Eye Near:    Bilateral Near:     Physical  Exam Constitutional:      General: She is not in acute distress. HENT:     Mouth/Throat:     Mouth: Mucous membranes are moist.  Cardiovascular:     Rate and Rhythm: Normal rate and regular rhythm.  Pulmonary:     Effort: Pulmonary effort is normal. No respiratory distress.  Abdominal:     General: Bowel sounds are normal.     Palpations: Abdomen is soft.     Tenderness: There is no abdominal tenderness. There is no right CVA tenderness, left CVA tenderness, guarding or rebound.  Musculoskeletal:        General: No deformity. Normal range of motion.  Skin:    General: Skin is warm and dry.     Capillary Refill: Capillary refill takes less than 2 seconds.  Neurological:     General: No focal deficit present.     Mental Status: She is alert and oriented to person, place, and time.     Sensory: No sensory deficit.     Motor: No weakness.     Gait: Gait normal.      UC Treatments / Results  Labs (all labs ordered are listed, but only abnormal results are displayed) Labs  Reviewed  POCT URINALYSIS DIP (MANUAL ENTRY) - Abnormal; Notable for the following components:      Result Value   Color, UA straw (*)    Spec Grav, UA >=1.030 (*)    All other components within normal limits  POCT URINE PREGNANCY    EKG   Radiology No results found.  Procedures Procedures (including critical care time)  Medications Ordered in UC Medications - No data to display  Initial Impression / Assessment and Plan / UC Course  I have reviewed the triage vital signs and the nursing notes.  Pertinent labs & imaging results that were available during my care of the patient were reviewed by me and considered in my medical decision making (see chart for details).    Urinary frequency, low back pain.  Instructed patient to increase her water intake.  Discussed that her urine does not show signs of infection and her urine pregnancy is negative.  Tylenol  or ibuprofen  as needed for back pain.  Instructed her to follow-up with her PCP if she is not improving.  Education provided on urinary frequency and back pain.  She agrees to plan of care.  Final Clinical Impressions(s) / UC Diagnoses   Final diagnoses:  Urinary frequency  Acute bilateral low back pain without sciatica     Discharge Instructions      Follow-up with your primary care provider if your symptoms are not improving.    ED Prescriptions   None    PDMP not reviewed this encounter.   Wellington Half, NP 02/06/24 [phone]

## 2024-08-26 ENCOUNTER — Encounter: Payer: Self-pay | Admitting: Emergency Medicine

## 2024-08-26 ENCOUNTER — Ambulatory Visit: Admission: EM | Admit: 2024-08-26 | Discharge: 2024-08-26 | Disposition: A | Discharge status: Home / Self Care

## 2024-08-26 DIAGNOSIS — U071 COVID-19: Secondary | ICD-10-CM

## 2024-08-26 HISTORY — DX: Anxiety disorder, unspecified: F41.9

## 2024-08-26 LAB — POC COVID19/FLU A&B COMBO: Covid Antigen, POC: POSITIVE — AB

## 2024-08-26 MED ORDER — ACETAMINOPHEN 325 MG PO TABS: 650.0000 mg | ORAL_TABLET | Freq: Once | ORAL | Status: DC

## 2024-08-26 MED ORDER — PAXLOVID (300/100) 20 X 150 MG & 10 X 100MG PO TBPK: 3.0000 | ORAL_TABLET | Freq: Two times a day (BID) | ORAL | 0 refills | Status: AC

## 2024-08-26 MED ORDER — ACETAMINOPHEN 160 MG/5ML PO SOLN
650.0000 mg | Freq: Once | ORAL | Status: AC
  Administered 2024-08-26: 650 mg via ORAL

## 2024-08-26 NOTE — ED Provider Notes (Signed)
 CAY RALPH PELT    CSN: [id number] Arrival date & time: 08/26/24  1105      History   Chief Complaint Chief Complaint  Patient presents with   Fever   Cough   Nasal Congestion   Headache    HPI Kayliee Atienza is a 28 y.o. female.   Patient presents for evaluation of nasal congestion, rhinorrhea, sore throat, nonproductive cough and intermittent headaches beginning 2 days ago.  Experiencing fever peaking at 100.71-day ago.  No known sick contact.  Has attempted use of over-the-counter sinus medication and Tylenol  without improvement.  Denies shortness of breath or wheezing.  Denies respiratory history, non-smoker.     Past Medical History:  Diagnosis Date   Anxiety     There are no active problems to display for this patient.   History reviewed. No pertinent surgical history.  OB History   No obstetric history on file.      Home Medications    Prior to Admission medications   Medication Sig Start Date End Date Taking? Authorizing Provider  FLUoxetine (PROZAC) 20 MG/5ML solution Take 10-20 mg by mouth daily. 07/12/24  Yes [provider]  acetaminophen  (TYLENOL ) 325 MG tablet Take 2 tablets (650 mg total) by mouth every 4 (four) hours as needed. 01/24/24 01/23/25  Clarine Ozell LABOR, MD  famotidine  (PEPCID ) 20 MG tablet Take 1 tablet (20 mg total) by mouth 2 (two) times daily for 14 days. 01/24/24 02/07/24  Clarine Ozell LABOR, MD  ofloxacin  (FLOXIN ) 0.3 % OTIC solution Place 10 drops into both ears daily. 05/07/23   Corlis Burnard DEL, NP    Family History History reviewed. No pertinent family history.  Social History Social History   Tobacco Use   Smoking status: Never   Smokeless tobacco: Never  Vaping Use   Vaping status: Never Used  Substance Use Topics   Alcohol use: Not Currently   Drug use: Never     Allergies   Patient has no known allergies.   Review of Systems Review of Systems  Constitutional:  Positive for fever. Negative for  activity change, appetite change, chills, diaphoresis, fatigue and unexpected weight change.  HENT:  Positive for congestion, rhinorrhea and sore throat. Negative for dental problem, drooling, ear discharge, ear pain, facial swelling, hearing loss, mouth sores, nosebleeds, postnasal drip, sinus pressure, sinus pain, sneezing, tinnitus, trouble swallowing and voice change.   Respiratory:  Positive for cough. Negative for apnea, choking, chest tightness, shortness of breath, wheezing and stridor.   Gastrointestinal: Negative.   Neurological:  Positive for headaches. Negative for dizziness, tremors, seizures, syncope, facial asymmetry, speech difficulty, weakness, light-headedness and numbness.     Physical Exam Triage Vital Signs ED Triage Vitals  Encounter Vitals Group     BP 08/26/24 1257 118/77     Girls Systolic BP Percentile --      Girls Diastolic BP Percentile --      Boys Systolic BP Percentile --      Boys Diastolic BP Percentile --      Pulse Rate 08/26/24 1257 (!) 109     Resp 08/26/24 1257 20     Temp 08/26/24 1257 (!) 100.5 F (38.1 C)     Temp Source 08/26/24 1257 Oral     SpO2 08/26/24 1257 98 %     Weight --      Height --      Head Circumference --      Peak Flow --  Pain Score 08/26/24 1303 1     Pain Loc --      Pain Education --      Exclude from Growth Chart --    No data found.  Updated Vital Signs BP 118/77 (BP Location: Left Arm)   Pulse (!) 109   Temp (!) 100.5 F (38.1 C) (Oral)   Resp 20   LMP 08/02/2024 (Approximate)   SpO2 98%   Visual Acuity Right Eye Distance:   Left Eye Distance:   Bilateral Distance:    Right Eye Near:   Left Eye Near:    Bilateral Near:     Physical Exam Constitutional:      Appearance: Normal appearance.  HENT:     Right Ear: Tympanic membrane, ear canal and external ear normal.     Left Ear: Tympanic membrane, ear canal and external ear normal.     Nose: Congestion present.     Mouth/Throat:      Pharynx: No oropharyngeal exudate or posterior oropharyngeal erythema.  Cardiovascular:     Rate and Rhythm: Normal rate and regular rhythm.     Pulses: Normal pulses.     Heart sounds: Normal heart sounds.  Pulmonary:     Effort: Pulmonary effort is normal.     Breath sounds: Normal breath sounds.  Neurological:     Mental Status: She is alert and oriented to person, place, and time.      UC Treatments / Results  Labs (all labs ordered are listed, but only abnormal results are displayed) Labs Reviewed  POC COVID19/FLU A&B COMBO - Abnormal; Notable for the following components:      Result Value   Covid Antigen, POC Positive (*)    All other components within normal limits    EKG   Radiology No results found.  Procedures Procedures (including critical care time)  Medications Ordered in UC Medications  acetaminophen  (TYLENOL ) 160 MG/5ML solution 650 mg (650 mg Oral Given 08/26/24 1320)    Initial Impression / Assessment and Plan / UC Course  I have reviewed the triage vital signs and the nursing notes.  Pertinent labs & imaging results that were available during my care of the patient were reviewed by me and considered in my medical decision making (see chart for details).  COVID-19  Fever 100.5 with associated tachycardia present in triage, given Tylenol , patient in no signs of distress nontoxic-appearing, stable for outpatient management, lungs clear to auscultation low suspicion for pneumonia or bronchitis therefore imaging deferred, flu testing negative and COVID test positive, discussed findings with patient, prescribed Paxlovid for treatment and recommended over-the-counter medications and nonpharmacological supportive measures with follow-up for any further concerns Final Clinical Impressions(s) / UC Diagnoses   Final diagnoses:  None   Discharge Instructions   None    ED Prescriptions   None    PDMP not reviewed this encounter.   Teresa Shelba SAUNDERS,  NP 08/26/24 1515

## 2024-08-26 NOTE — ED Triage Notes (Signed)
 Patient reports fever 100. 7 last night. Patient to Tylenol  15 ml last night. Patient is unable to swallow pills. Patient also complains runny nose, headache, cough x 2 days. Rates pain 1/10.  Patient has been OTC sinus medication with mild relief. Patient report she has been around her dad he was sick unsure what he had.

## 2024-08-26 NOTE — Discharge Instructions (Addendum)
 Covid 19 is a virus and should steadily improve in time it can take up to 7 to 10 days before you truly start to see a turnaround however things will get better    Per the CDC you will need to quarantine and to your 24 hours without fever, if no fever may continue activity wearing mask  Take Paxlovid twice daily for 5 days, this is the antiviral which is thought to reduce symptoms and timeline that you are sick, does not fully take away your illness  You can take Tylenol  and/or Ibuprofen  as needed for fever reduction and pain relief.   For cough: honey 1/2 to 1 teaspoon (you can dilute the honey in water or another fluid).  You can also use guaifenesin and dextromethorphan for cough. You can use a humidifier for chest congestion and cough.  If you don't have a humidifier, you can sit in the bathroom with the hot shower running.      For sore throat: try warm salt water gargles, cepacol lozenges, throat spray, warm tea or water with lemon/honey, popsicles or ice, or OTC cold relief medicine for throat discomfort.   For congestion: take a daily anti-histamine like Zyrtec, Claritin, and a oral decongestant, such as pseudoephedrine.  You can also use Flonase 1-2 sprays in each nostril daily.   It is important to stay hydrated: drink plenty of fluids (water, gatorade/powerade/pedialyte, juices, or teas) to keep your throat moisturized and help further relieve irritation/discomfort.
